# Patient Record
Sex: Male | Born: 1985 | Race: Asian | Hispanic: No | Marital: Married | Smoking: Never smoker
Health system: Southern US, Community
[De-identification: ages and names within clinical notes are randomized; demographics above are authoritative.]

## PROBLEM LIST (undated history)

## (undated) DIAGNOSIS — M1990 Unspecified osteoarthritis, unspecified site: Secondary | ICD-10-CM

## (undated) HISTORY — PX: LYMPH GLAND EXCISION: SHX13

---

## 2011-08-05 ENCOUNTER — Ambulatory Visit (INDEPENDENT_AMBULATORY_CARE_PROVIDER_SITE_OTHER): Payer: No Typology Code available for payment source | Admitting: Family Medicine

## 2011-08-05 ENCOUNTER — Ambulatory Visit: Payer: No Typology Code available for payment source

## 2011-08-05 VITALS — BP 121/85 | HR 79 | Temp 98.3°F | Resp 16 | Ht 64.0 in | Wt 171.0 lb

## 2011-08-05 DIAGNOSIS — M79643 Pain in unspecified hand: Secondary | ICD-10-CM

## 2011-08-05 DIAGNOSIS — M2550 Pain in unspecified joint: Secondary | ICD-10-CM

## 2011-08-05 DIAGNOSIS — M79609 Pain in unspecified limb: Secondary | ICD-10-CM

## 2011-08-05 MED ORDER — MELOXICAM 7.5 MG PO TABS: 7.5000 mg | ORAL_TABLET | Freq: Two times a day (BID) | ORAL | Status: AC | PRN

## 2011-08-05 NOTE — Progress Notes (Signed)
  [facility]:  Office Visit  Chief Complaint:  Chief Complaint  Patient presents with  . Hand Pain    hand pain when twisting open jars.  x 2 years    HPI: Wayne Wright is a 26 y.o. male who complains of : 1. Intermittent joint pain x 2 years when opening jars, when use, sharp pain, no swelling or redness associated  with pain, tries just ice with relief in 1 day, if not then takes several days. Denies RA/OA in family hisotry. He also sometimes has pain underneath bilateral big toes. Last flare up  was ysterday. No history of gout. No history of foods that would trigger gout except he does eat a lot of protein.   History reviewed. No pertinent past medical history. History reviewed. No pertinent past surgical history. History   Social History  . Marital Status: Married    Spouse Name: N/A    Number of Children: N/A  . Years of Education: N/A   Social History Main Topics  . Smoking status: Never Smoker   . Smokeless tobacco: None  . Alcohol Use: No  . Drug Use: No  . Sexually Active: None   Other Topics Concern  . None   Social History Narrative  . None   No family history on file. No Known Allergies Prior to Admission medications   Not on File     ROS: The patient denies fevers, chills, night sweats, unintentional weight loss, chest pain, palpitations, wheezing, dyspnea on exertion, nausea, vomiting, abdominal pain, dysuria, hematuria, melena, numbness, weakness, or tingling. + joint pain  All other systems have been reviewed and were otherwise negative with the exception of those mentioned in the HPI and as above.    PHYSICAL EXAM: Filed Vitals:   08/05/11 1551  BP: 121/85  Pulse: 79  Temp: 98.3 F (36.8 C)  Resp: 16   Filed Vitals:   08/05/11 1551  Height: 5\' 4"  (1.626 m)  Weight: 171 lb (77.565 kg)   Body mass index is 29.35 kg/(m^2).  General: Alert, no acute distress HEENT:  Normocephalic, atraumatic, oropharynx patent.    Cardiovascular:  Regular rate and rhythm, no rubs murmurs or gallops.  No Carotid bruits, radial pulse intact. No pedal edema.  Respiratory: Clear to auscultation bilaterally.  No wheezes, rales, or rhonchi.  No cyanosis, no use of accessory musculature GI: No organomegaly, abdomen is soft and non-tender, positive bowel sounds.  No masses. Skin: No rashes. Neurologic: Facial musculature symmetric. Psychiatric: Patient is appropriate throughout our interaction. Lymphatic: No cervical lymphadenopathy Musculoskeletal: Gait intact. Right hand: + minimal swelling of right fingers   LABS: No results found for this or any previous visit.   EKG/XRAY:   Primary read interpreted by Dr. Conley Rolls at [facility]. No fx/dislocations/jt deformities/OA of  bilateral hands   ASSESSMENT/PLAN: Encounter Diagnoses  Name Primary?  . Hand pain Yes  . Joint pain    Mobic 7.5 mg BID prn 1 month trial Pending RF ? Inflammatory arthritis    Wayne Arizmendi PHUONG, DO 08/05/2011 5:19 PM

## 2011-08-06 LAB — RHEUMATOID FACTOR: Rheumatoid fact SerPl-aCnc: <10 [IU]/mL (ref <=14)

## 2011-08-21 ENCOUNTER — Telehealth: Payer: Self-pay | Admitting: Family Medicine

## 2011-08-21 NOTE — Telephone Encounter (Signed)
LM that RF test normal.

## 2011-09-23 ENCOUNTER — Telehealth: Payer: Self-pay

## 2011-09-23 NOTE — Telephone Encounter (Signed)
Records ready for pickup. Patient notified. °

## 2011-09-23 NOTE — Telephone Encounter (Signed)
PT IS HAVING AN ISSUE WITH HIS INSURANCE COMPANY AND WOULD LIKE TO COME BY AND P/U A COPY OF HIS RECORDS WAS TOLD IT COULD TAKE UP TO 72 HRS. PLEASE CALL [phone]

## 2012-08-05 ENCOUNTER — Ambulatory Visit: Payer: No Typology Code available for payment source

## 2012-08-05 ENCOUNTER — Ambulatory Visit (INDEPENDENT_AMBULATORY_CARE_PROVIDER_SITE_OTHER): Payer: No Typology Code available for payment source | Admitting: Family Medicine

## 2012-08-05 VITALS — BP 114/77 | HR 71 | Temp 98.1°F | Resp 18 | Ht 63.6 in | Wt 181.0 lb

## 2012-08-05 DIAGNOSIS — M545 Low back pain, unspecified: Secondary | ICD-10-CM

## 2012-08-05 DIAGNOSIS — T148XXA Other injury of unspecified body region, initial encounter: Secondary | ICD-10-CM

## 2012-08-05 MED ORDER — TRAMADOL HCL 50 MG PO TABS: 50.0000 mg | ORAL_TABLET | Freq: Four times a day (QID) | ORAL | Status: DC | PRN

## 2012-08-05 MED ORDER — METHOCARBAMOL 500 MG PO TABS: 500.0000 mg | ORAL_TABLET | Freq: Every evening | ORAL | Status: DC | PRN

## 2012-08-05 NOTE — Patient Instructions (Signed)

## 2012-08-05 NOTE — Progress Notes (Signed)
  [facility]:  Office Visit  Chief Complaint:  Chief Complaint  Patient presents with  . Back Pain    x 3 days - moving sofa - pain down legs to mid thigh posteriorly    HPI: Wayne Wright is a 27 y.o. male who complains of  Low back pain after Moving sofa down stairs 2 days ago. He  was at the top of the stairs, he had pain and numbness going down back initially. It was not a super heavy sofa.  He has pain. He denies numbness currently, incontinence. Denies prior low back pain. Sharp 7/10 pain, has tried ibuprofen for it. Denies numbness, weakness, tingling. Walking actually makes it better, worse with prolong sitting or standing.   History reviewed. No pertinent past medical history. Past Surgical History  Procedure Laterality Date  . Lymph gland excision     History   Social History  . Marital Status: Married    Spouse Name: N/A    Number of Children: N/A  . Years of Education: N/A   Social History Main Topics  . Smoking status: Never Smoker   . Smokeless tobacco: Never Used  . Alcohol Use: No  . Drug Use: No  . Sexually Active: None   Other Topics Concern  . None   Social History Narrative  . None   Family History  Problem Relation Age of Onset  . Kidney disease Father    No Known Allergies Prior to Admission medications   Not on File     ROS: The patient denies fevers, chills, night sweats, unintentional weight loss, chest pain, palpitations, wheezing, dyspnea on exertion, nausea, vomiting, abdominal pain, dysuria, hematuria, melena, numbness, weakness, or tingling.  All other systems have been reviewed and were otherwise negative with the exception of those mentioned in the HPI and as above.    PHYSICAL EXAM: Filed Vitals:   08/05/12 0934  BP: 114/77  Pulse: 71  Temp: 98.1 F (36.7 C)  Resp: 18   Filed Vitals:   08/05/12 0934  Height: 5' 3.6" (1.615 m)  Weight: 181 lb (82.101 kg)   Body mass index is 31.48 kg/(m^2).  General:  Alert, no acute distress HEENT:  Normocephalic, atraumatic, oropharynx patent.  Cardiovascular:  Regular rate and rhythm, no rubs murmurs or gallops.  No Carotid bruits, radial pulse intact. No pedal edema.  Respiratory: Clear to auscultation bilaterally.  No wheezes, rales, or rhonchi.  No cyanosis, no use of accessory musculature GI: No organomegaly, abdomen is soft and non-tender, positive bowel sounds.  No masses. Skin: No rashes. Neurologic: Facial musculature symmetric. Psychiatric: Patient is appropriate throughout our interaction. Lymphatic: No cervical lymphadenopathy Musculoskeletal: Gait intact. 5/5 strength, sensation intact, 2/2 DTRs Decrease ROM in flexiosn, ext and SB, normal rotation + tenderness mid low back--L4   LABS: Results for orders placed in visit on 08/05/11  RHEUMATOID FACTOR      Result Value Range   Rheumatoid Factor <10  <=14 IU/mL     EKG/XRAY:   Primary read interpreted by Dr. Conley Rolls at [facility]. No fractures or dislocation   ASSESSMENT/PLAN: Encounter Diagnoses  Name Primary?  . Lower back pain Yes  . Sprain and strain    Patient will take ibuprofen 600-800 mg q8hrs prn with food Rx Robaxen and also tramadol Back exercises given F/u prn    Vern Prestia PHUONG, DO 08/05/2012 10:44 AM

## 2012-09-01 ENCOUNTER — Ambulatory Visit (INDEPENDENT_AMBULATORY_CARE_PROVIDER_SITE_OTHER): Payer: No Typology Code available for payment source | Admitting: Family Medicine

## 2012-09-01 ENCOUNTER — Ambulatory Visit: Payer: No Typology Code available for payment source

## 2012-09-01 VITALS — BP 114/75 | HR 82 | Temp 98.3°F | Resp 16 | Ht 64.0 in | Wt 178.4 lb

## 2012-09-01 DIAGNOSIS — M549 Dorsalgia, unspecified: Secondary | ICD-10-CM

## 2012-09-01 DIAGNOSIS — M5410 Radiculopathy, site unspecified: Secondary | ICD-10-CM

## 2012-09-01 DIAGNOSIS — M25552 Pain in left hip: Secondary | ICD-10-CM

## 2012-09-01 DIAGNOSIS — IMO0002 Reserved for concepts with insufficient information to code with codable children: Secondary | ICD-10-CM

## 2012-09-01 DIAGNOSIS — M25559 Pain in unspecified hip: Secondary | ICD-10-CM

## 2012-09-01 MED ORDER — NAPROXEN 500 MG PO TABS: 500.0000 mg | ORAL_TABLET | Freq: Two times a day (BID) | ORAL | Status: DC

## 2012-09-01 MED ORDER — METHYLPREDNISOLONE 4 MG PO KIT: PACK | ORAL | Status: DC

## 2012-09-01 NOTE — Progress Notes (Signed)
[facility]:  Office Visit  Chief Complaint:  Chief Complaint  Patient presents with  . Leg Pain    runs from lower back down leg. meds not working that were given at last visit  . Back Pain    HPI: Wayne Wright is a 27 y.o. male who complains of  Low back pain and left leg pain and radicular pain for 3 weeks now, thought it would go away with time. He can't walk very far not more than 50 yards, have to limp , sitting down there is a discomofrt,  Pain in leg when he urinates,  Has to bend so that he can walk without pain.  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 Has taken tramadol and msk relaxer with minimal relief No weakness, no numbness/tingling, just pain  History reviewed. No pertinent past medical history. Past Surgical History  Procedure Laterality Date  . Lymph gland excision     History   Social History  . Marital Status: Married    Spouse Name: N/A    Number of Children: N/A  . Years of Education: N/A   Social History Main Topics  . Smoking status: Never Smoker   . Smokeless tobacco: Never Used  . Alcohol Use: No  . Drug Use: No  . Sexually Active: None   Other Topics Concern  . None   Social History Narrative  . None   Family History  Problem Relation Age of Onset  . Kidney disease Father    No Known Allergies Prior to Admission medications   Medication Sig Start Date End Date Taking? Authorizing Provider  methocarbamol (ROBAXIN) 500 MG tablet Take 1 tablet (500 mg total) by mouth at bedtime as needed. 08/05/12   Kennidy Lamke P Hadassa Cermak, DO  traMADol (ULTRAM) 50 MG tablet Take 1 tablet (50 mg total) by mouth every 6 (six) hours as needed for pain. 08/05/12   Eesha Schmaltz P Dung Salinger, DO     ROS: The patient denies fevers, chills, night sweats, unintentional weight loss, chest pain, palpitations, wheezing, dyspnea on exertion, nausea, vomiting, abdominal  pain, dysuria, hematuria, melena, numbness, weakness, or tingling.   All other systems have been reviewed and were otherwise negative with the exception of those mentioned in the HPI and as above.    PHYSICAL EXAM: Filed Vitals:   09/01/12 1657  BP: 114/75  Pulse: 82  Temp: 98.3 F (36.8 C)  Resp: 16   Filed Vitals:   09/01/12 1657  Height: 5\' 4"  (1.626 m)  Weight: 178 lb 6.4 oz (80.922 kg)   Body mass index is 30.61 kg/(m^2).  General: Alert, no acute distress HEENT:  Normocephalic, atraumatic, oropharynx patent.  Cardiovascular:  Regular rate and rhythm, no rubs murmurs or gallops.  No Carotid bruits, radial pulse intact. No pedal edema.  Respiratory: Clear to auscultation bilaterally.  No wheezes, rales, or rhonchi.  No cyanosis, no use of accessory musculature GI: No organomegaly, abdomen is soft and non-tender, positive bowel sounds.  No masses. Skin: No rashes. Neurologic: Facial musculature symmetric. Psychiatric: Patient is appropriate throughout our interaction. Lymphatic: No cervical lymphadenopathy Musculoskeletal: Gait limping Lumbar spine-normal Left hip 5/5 strength ROM intact, sensation intact No saddle anesthesia. Rectal exam normal sphinter tone   LABS: Results for orders placed in visit on 08/05/11  RHEUMATOID FACTOR      Result Value Range  Rheumatoid Factor <10  <=14 IU/mL     EKG/XRAY:   Primary read interpreted by Dr[facility] Conley Rolls at [facility]. No fx/dislocation    ASSESSMENT/PLAN: Encounter Diagnoses  Name Primary?  . Left hip pain Yes  . Back pain   . Radicular pain    D/w patient xrays Rx prednisone Rx naproxen prn C/w Tramadol, Robaxin prn Advise proper body mechanics, lifting.  F/u prn    Kylieann Eagles PHUONG, DO 09/01/2012 6:39 PM

## 2012-09-01 NOTE — Patient Instructions (Signed)
Back Exercises Back exercises help treat and prevent back injuries. The goal of back exercises is to increase the strength of your abdominal and back muscles and the flexibility of your back. These exercises should be started when you no longer have back pain. Back exercises include:  Pelvic Tilt. Lie on your back with your knees bent. Tilt your pelvis until the lower part of your back is against the floor. Hold this position 5 to 10 sec and repeat 5 to 10 times.  Knee to Chest. Pull first 1 knee up against your chest and hold for 20 to 30 seconds, repeat this with the other knee, and then both knees. This may be done with the other leg straight or bent, whichever feels better.  Sit-Ups or Curl-Ups. Bend your knees 90 degrees. Start with tilting your pelvis, and do a partial, slow sit-up, lifting your trunk only 30 to 45 degrees off the floor. Take at least 2 to 3 seconds for each sit-up. Do not do sit-ups with your knees out straight. If partial sit-ups are difficult, simply do the above but with only tightening your abdominal muscles and holding it as directed.  Hip-Lift. Lie on your back with your knees flexed 90 degrees. Push down with your feet and shoulders as you raise your hips a couple inches off the floor; hold for 10 seconds, repeat 5 to 10 times.  Back arches. Lie on your stomach, propping yourself up on bent elbows. Slowly press on your hands, causing an arch in your low back. Repeat 3 to 5 times. Any initial stiffness and discomfort should lessen with repetition over time.  Shoulder-Lifts. Lie face down with arms beside your body. Keep hips and torso pressed to floor as you slowly lift your head and shoulders off the floor. Do not overdo your exercises, especially in the beginning. Exercises may cause you some mild back discomfort which lasts for a few minutes; however, if the pain is more severe, or lasts for more than 15 minutes, do not continue exercises until you see your caregiver.  Improvement with exercise therapy for back problems is slow.  See your caregivers for assistance with developing a proper back exercise program. Document Released: 04/21/2004 Document Revised: 06/06/2011 Document Reviewed: 01/13/2011 Exit[employer] Patient Information 2014 [employer].  

## 2013-03-22 ENCOUNTER — Ambulatory Visit: Payer: No Typology Code available for payment source

## 2013-03-22 ENCOUNTER — Ambulatory Visit (INDEPENDENT_AMBULATORY_CARE_PROVIDER_SITE_OTHER): Payer: No Typology Code available for payment source | Admitting: Family Medicine

## 2013-03-22 VITALS — BP 120/70 | HR 93 | Temp 100.2°F | Resp 16 | Ht 64.0 in | Wt 182.0 lb

## 2013-03-22 DIAGNOSIS — R05 Cough: Secondary | ICD-10-CM

## 2013-03-22 DIAGNOSIS — J189 Pneumonia, unspecified organism: Secondary | ICD-10-CM

## 2013-03-22 DIAGNOSIS — R059 Cough, unspecified: Secondary | ICD-10-CM

## 2013-03-22 DIAGNOSIS — R509 Fever, unspecified: Secondary | ICD-10-CM

## 2013-03-22 LAB — POCT CBC
Granulocyte percent: 72.5 %G (ref 37–80)
HCT, POC: 47 % (ref 43.5–53.7)
Hemoglobin: 14.8 g/dL (ref 14.1–18.1)
Lymph, poc: 3.3 (ref 0.6–3.4)
MCH, POC: 28.4 pg (ref 27–31.2)
MCHC: 31.5 g/dL — AB (ref 31.8–35.4)
MCV: 90 fL (ref 80–97)
MID (cbc): 0.9 (ref 0–0.9)
MPV: 8.1 fL (ref 0–99.8)
POC Granulocyte: 11.1 — AB (ref 2–6.9)
POC LYMPH PERCENT: 21.3 %L (ref 10–50)
POC MID %: 6.2 %M (ref 0–12)
Platelet Count, POC: 282 10*3/uL (ref 142–424)
RBC: 5.22 M/uL (ref 4.69–6.13)
RDW, POC: 13.8 %
WBC: 15.3 10*3/uL — AB (ref 4.6–10.2)

## 2013-03-22 LAB — POCT INFLUENZA A/B
Influenza A, POC: NEGATIVE
Influenza B, POC: NEGATIVE

## 2013-03-22 MED ORDER — LEVOFLOXACIN 500 MG PO TABS: 500.0000 mg | ORAL_TABLET | Freq: Every day | ORAL | Status: DC

## 2013-03-22 MED ORDER — HYDROCODONE-HOMATROPINE 5-1.5 MG/5ML PO SYRP: 5.0000 mL | ORAL_SOLUTION | Freq: Three times a day (TID) | ORAL | Status: DC | PRN

## 2013-03-22 NOTE — Progress Notes (Signed)
   Subjective:    Patient ID: Wayne Wright, male    DOB: 1985-11-22, 27 y.o.   MRN: [id number]  HPI 27 year old male presents for evaluation of cough x 2 weeks and fever x 3 days.  States cough began after being exposed to his niece who had been diagnosed with bronchitis.  Cough has been productive of yellow/green mucous. Fever began 3 days ago. Admits to some nasal congestion, PND, and sore throat. No hx of strep. No known strep or flu contacts.  He has been taking ibuprofen which does help with the fever.  Denies chest pain, wheezing, SOB, otalgia, headache, or dizziness. He does admit to some chest tightness while taking deep breaths.   Patient is otherwise healthy with no other concerns today.      Review of Systems  Constitutional: Positive for chills and fatigue.  HENT: Positive for congestion, postnasal drip, sinus pressure and sore throat.   Respiratory: Positive for chest tightness. Negative for shortness of breath and wheezing.   Cardiovascular: Negative for chest pain.  Gastrointestinal: Negative for nausea and vomiting.  Neurological: Negative for dizziness and headaches.       Objective:   Physical Exam  Constitutional: He appears well-developed and well-nourished.  HENT:  Head: Normocephalic and atraumatic.  Right Ear: Hearing, tympanic membrane, external ear and ear canal normal.  Left Ear: Hearing, tympanic membrane, external ear and ear canal normal.  Mouth/Throat: Uvula is midline, oropharynx is clear and moist and mucous membranes are normal.  Eyes: Conjunctivae are normal.  Neck: Normal range of motion. Neck supple.  Cardiovascular: Normal rate, regular rhythm and normal heart sounds.   Pulmonary/Chest: Effort normal and breath sounds normal.  Lymphadenopathy:    He has no cervical adenopathy.  Neurological: He is alert.  Psychiatric: He has a normal mood and affect. His behavior is normal. Judgment and thought content normal.    [facility] reading (PRIMARY) by  Dr.  Cleta Alberts as linear density seen on later. Increased marking bilaterally with small infiltrate on right cardriophrenic border.  Please comment on linear density.         Assessment & Plan:  Pneumonia - Plan: levofloxacin (LEVAQUIN) 500 MG tablet  Cough - Plan: DG Chest 2 View, POCT CBC, POCT Influenza A/B, HYDROcodone-homatropine (HYCODAN) 5-1.5 MG/5ML syrup  Fever, unspecified - Plan: DG Chest 2 View, POCT CBC, POCT Influenza A/B  Will treat with Levaquin 500 mg daily x 10 days Hycodan q8hours prn cough - caution sedation Increase fluids and rest RTC for recheck if fever does not break over next 24-48 hours, sooner if worse

## 2013-04-24 NOTE — Progress Notes (Signed)
Reviewed documentation and xray and agree w/ assessment and plan. Ell Tiso, MD MPH   

## 2013-07-15 ENCOUNTER — Ambulatory Visit: Payer: 59 | Admitting: Neurology

## 2013-07-24 ENCOUNTER — Ambulatory Visit (INDEPENDENT_AMBULATORY_CARE_PROVIDER_SITE_OTHER): Payer: 59 | Admitting: Neurology

## 2013-07-24 ENCOUNTER — Encounter: Payer: Self-pay | Admitting: Neurology

## 2013-07-24 ENCOUNTER — Encounter (INDEPENDENT_AMBULATORY_CARE_PROVIDER_SITE_OTHER): Payer: Self-pay

## 2013-07-24 VITALS — BP 111/77 | HR 77 | Ht 63.5 in | Wt 185.5 lb

## 2013-07-24 DIAGNOSIS — H5440 Blindness, one eye, unspecified eye: Secondary | ICD-10-CM | POA: Insufficient documentation

## 2013-07-24 DIAGNOSIS — M2550 Pain in unspecified joint: Secondary | ICD-10-CM | POA: Insufficient documentation

## 2013-07-24 NOTE — Progress Notes (Signed)
PATIENT: Wayne Wright DOB: May 16, 1985  HISTORICAL  Wayne Wright is about 28 years old right-handed male, referred by urgent care for evaluation of pain at both hands, stiffness  He noticed issues with both hands for 4 years, since 2011, everytime he tried to open a jaw or a tight screw, if he put any hard pressure on his hand, he felt bilateral hands joints achy pain afterwards, especially metaphalangeal joints, he could not move any fingers for few hours,, ibuprofen, ice would help.   If not taking ibuprofen, instead of dealing with achy pain for few hours, he has to take 2 or 3 days to recover  There was similar achy painful sensation at the metatarsal joints occasionally, with prolonged walking, hiking,  He denies persistent sensory loss, no weakness, no neck pain, no radiating pain,  He immigrant from [place], noticed gradual worsening of his left vision over the past 6 years, around 2009, he noticed gradual worsening left vision, was seen by different ophthalmologists over the past few years without clear etiology found. Per patient, he first noticed difficulty when he was watching 3-D movies, he noticed a huge vision difference between left and right eye.  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  Laboratory evaluation showed normal TSH, ESR, uric acid, rheumatoid factor, negative ANA   REVIEW OF SYSTEMS: Full 14 system review of systems performed and notable only for joint pain  ALLERGIES: No Known Allergies  HOME MEDICATIONS: No current outpatient prescriptions on file prior to visit.   No current facility-administered medications on file prior to visit.    PAST MEDICAL HISTORY: History reviewed. No pertinent past medical history.  PAST SURGICAL HISTORY: Past Surgical History  Procedure Laterality Date  . Lymph gland excision      FAMILY HISTORY: Family History  Problem Relation Age of Onset  . Kidney disease Father     SOCIAL HISTORY:  History    Social History  . Marital Status: Married    Spouse Name: Wayne Wright    Number of Children: o  . Years of Education: college   Occupational History  . Computers    Social History Main Topics  . Smoking status: Never Smoker   . Smokeless tobacco: Never Used  . Alcohol Use: No  . Drug Use: No  . Sexual Activity: Not on file   Other Topics Concern  . Not on file   Social History Narrative   Patient lives at home with his wife Wayne Wright)    Patient works full time at [employer].   Education B.S.   Right handed   Caffeine none    PHYSICAL EXAM   Filed Vitals:   07/24/13 0808  BP: 111/77  Pulse: 77  Height: 5' 3.5" (1.613 m)  Weight: 185 lb 8 oz (84.142 kg)    Not recorded    Body mass index is 32.34 kg/(m^2).   Generalized: In no acute distress  Neck: Supple, no carotid bruits   Cardiac: Regular rate rhythm  Pulmonary: Clear to auscultation bilaterally  Musculoskeletal: No deformity  Neurological examination  Mentation: Alert oriented to time, place, history taking, and causual conversation  Cranial nerve II-XII: left pupil afferent defect.  Extraocular movements were full.  Visual field were full on confrontational test. Bilateral fundi were sharp. Left extropia, right eye is the dominant eye. Facial sensation and strength were normal. Hearing was intact to finger rubbing bilaterally. Uvula tongue midline.  Head turning and shoulder shrug and were  normal and symmetric.Tongue protrusion into cheek strength was normal.  Motor: Normal tone, bulk and strength.  Sensory: Intact to fine touch, pinprick, preserved vibratory sensation, and proprioception at toes.  Coordination: Normal finger to nose, heel-to-shin bilaterally there was no truncal ataxia  Gait: Rising up from seated position without assistance, normal stance, without trunk ataxia, moderate stride, good arm swing, smooth turning, able to perform tiptoe, and heel walking without difficulty.    Romberg signs: Negative  Deep tendon reflexes: Brachioradialis 2/2, biceps 2/2, triceps 2/2, patellar 2/2, Achilles 2/2, plantar responses were flexor bilaterally.   DIAGNOSTIC DATA (LABS, IMAGING, TESTING) - I reviewed patient records, labs, notes, testing and imaging myself where available.  Lab Results  Component Value Date   WBC 15.3* 03/22/2013   HGB 14.8 03/22/2013   HCT 47.0 03/22/2013   MCV 90.0 03/22/2013     ASSESSMENT AND PLAN  Wayne Wright is a 28 y.o. male complains of  6 years history of gradually worsening left vision, occasionally left-sided headaches, also presenting with intermittent bilateral hands, and feet achy pain, on examination,  He has left extropia, finger counting on left eye, diffusely hyperreflexia of bilateral upper and lower extremities.  1. Complete evaluation with MRI of brain with and without contrast, MRI of cervical spine to rule out cervical myelopathy with his diffuse hyperreflexia 2. Laboratory evaluations, including vitamin B12, and inflammatory markers 3.I will call him about result, further evaluations will depend on MRI findings,  Marcial Pacas, M.D. Ph.D.  [facility] [address] [place], [place] [address] [phone]

## 2013-07-25 LAB — COMPREHENSIVE METABOLIC PANEL
ALBUMIN: 4.5 g/dL (ref 3.5–5.5)
ALT: 55 IU/L — ABNORMAL HIGH (ref 0–44)
AST: 28 IU/L (ref 0–40)
Albumin/Globulin Ratio: 1.3 (ref 1.1–2.5)
Alkaline Phosphatase: 82 IU/L (ref 39–117)
BUN/Creatinine Ratio: 21 — ABNORMAL HIGH (ref 8–19)
BUN: 16 mg/dL (ref 6–20)
CALCIUM: 9.8 mg/dL (ref 8.7–10.2)
CO2: 21 mmol/L (ref 18–29)
CREATININE: 0.78 mg/dL (ref 0.76–1.27)
Chloride: 99 mmol/L (ref 97–108)
GFR calc Af Amer: 143 mL/min/{1.73_m2} (ref >59)
GFR calc non Af Amer: 124 mL/min/{1.73_m2} (ref >59)
GLOBULIN, TOTAL: 3.4 g/dL (ref 1.5–4.5)
Glucose: 95 mg/dL (ref 65–99)
Potassium: 4.4 mmol/L (ref 3.5–5.2)
Sodium: 142 mmol/L (ref 134–144)
Total Bilirubin: 0.7 mg/dL (ref 0.0–1.2)
Total Protein: 7.9 g/dL (ref 6.0–8.5)

## 2013-07-25 LAB — FOLATE: FOLATE: 7 ng/mL (ref >3.0)

## 2013-07-25 LAB — CBC WITH DIFFERENTIAL
BASOS: 0 %
Basophils Absolute: 0 10*3/uL (ref 0.0–0.2)
EOS: 2 %
Eosinophils Absolute: 0.2 10*3/uL (ref 0.0–0.4)
HCT: 46.2 % (ref 37.5–51.0)
HEMOGLOBIN: 15.5 g/dL (ref 12.6–17.7)
IMMATURE GRANS (ABS): 0 10*3/uL (ref 0.0–0.1)
Immature Granulocytes: 0 %
LYMPHS ABS: 2.7 10*3/uL (ref 0.7–3.1)
Lymphs: 33 %
MCH: 29 pg (ref 26.6–33.0)
MCHC: 33.5 g/dL (ref 31.5–35.7)
MCV: 87 fL (ref 79–97)
MONOCYTES: 6 %
Monocytes Absolute: 0.5 10*3/uL (ref 0.1–0.9)
NEUTROS PCT: 59 %
Neutrophils Absolute: 4.9 10*3/uL (ref 1.4–7.0)
PLATELETS: 221 10*3/uL (ref 150–379)
RBC: 5.34 x10E6/uL (ref 4.14–5.80)
RDW: 14.2 % (ref 12.3–15.4)
WBC: 8.3 10*3/uL (ref 3.4–10.8)

## 2013-07-25 LAB — RPR: RPR: NONREACTIVE

## 2013-07-25 LAB — CK: Total CK: 106 U/L (ref 24–204)

## 2013-07-25 LAB — HIV ANTIBODY (ROUTINE TESTING W REFLEX): HIV 1/HIV 2 AB: NONREACTIVE

## 2013-07-25 LAB — C-REACTIVE PROTEIN: CRP: 7.3 mg/L — ABNORMAL HIGH (ref 0.0–4.9)

## 2013-07-25 LAB — VITAMIN B12: Vitamin B-12: 464 pg/mL (ref 211–946)

## 2013-07-25 LAB — ANA W/REFLEX IF POSITIVE: Anti Nuclear Antibody(ANA): NEGATIVE

## 2013-07-31 ENCOUNTER — Other Ambulatory Visit: Payer: 59

## 2013-08-01 ENCOUNTER — Ambulatory Visit (INDEPENDENT_AMBULATORY_CARE_PROVIDER_SITE_OTHER): Payer: 59

## 2013-08-01 ENCOUNTER — Other Ambulatory Visit: Payer: 59

## 2013-08-01 DIAGNOSIS — M2550 Pain in unspecified joint: Secondary | ICD-10-CM

## 2013-08-01 MED ORDER — GADOPENTETATE DIMEGLUMINE 469.01 MG/ML IV SOLN: 17.0000 mL | Freq: Once | INTRAVENOUS | Status: AC | PRN

## 2013-08-02 ENCOUNTER — Telehealth: Payer: Self-pay | Admitting: Neurology

## 2013-08-02 NOTE — Progress Notes (Signed)
Quick Note:  Left message with lab results, per Dr. Terrace ArabiaYan. Told to call the office with any questions. ______

## 2013-08-02 NOTE — Telephone Encounter (Signed)
Pt called wanting to get more information and has more questions, please cal pt concerning this matter. Thanks

## 2013-08-05 NOTE — Telephone Encounter (Signed)
I have called patient, there is no significant abnormality at imaging, and the laboratory evaluations, slight elevated C. reactive protein of unknown clinical significance,

## 2013-08-05 NOTE — Telephone Encounter (Signed)
Dr. Terrace ArabiaYan please call the patient he has questions about test results and visit. Best contact for patient is [phone] (H)

## 2013-08-06 ENCOUNTER — Telehealth: Payer: Self-pay | Admitting: Neurology

## 2013-08-06 NOTE — Telephone Encounter (Signed)
Pt's husband calling wanting to know what the next step is after the MRI. Please advise

## 2013-08-06 NOTE — Telephone Encounter (Signed)
Patient spouse called wanting to know what's next after the MRI.  Please call and advise.

## 2013-08-06 NOTE — Telephone Encounter (Signed)
I have called [phone] second time,   I relayed the message that MRI cervical and brain, lab showed no siginficant abnormalities,  He should continue follow up with his pcp or rheumatologist

## 2014-11-13 IMAGING — CR DG LUMBAR SPINE COMPLETE 4+V
5 series · 5 of 5 positions shown · non-contrast
Comparison: None.

CLINICAL DATA: Low back pain after moving furniture.

LUMBAR SPINE - COMPLETE 4+ VIEW

[AP]
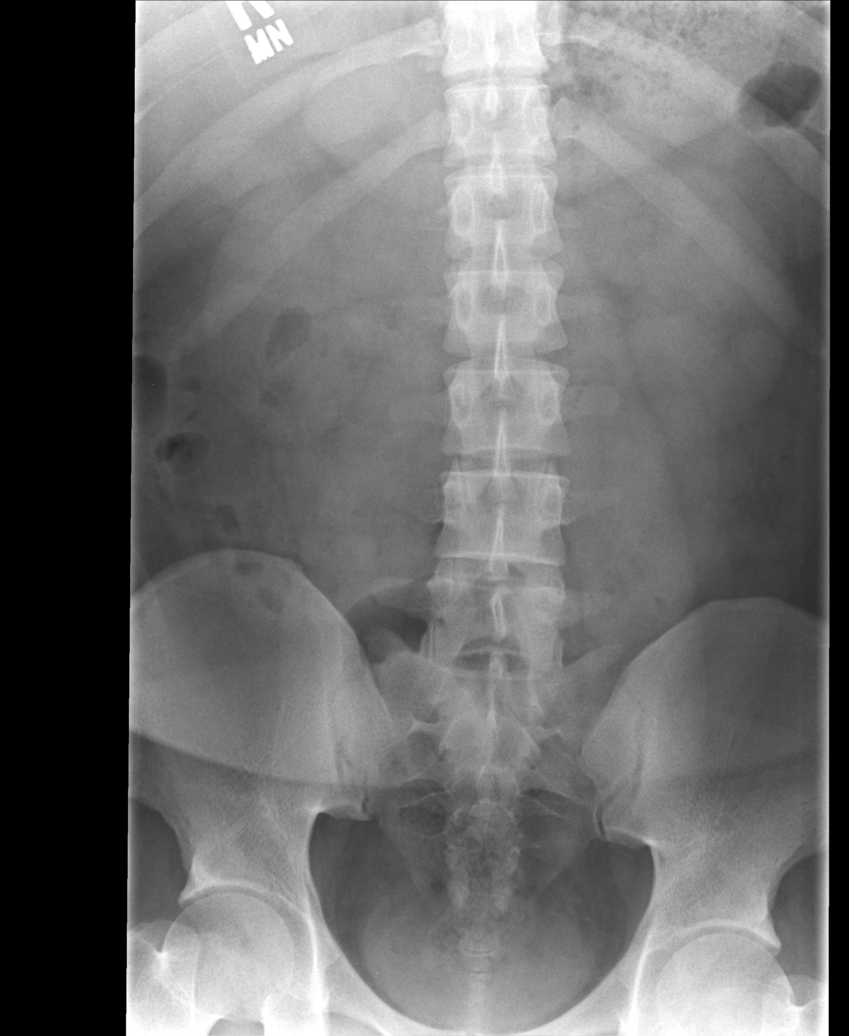

[rpo]
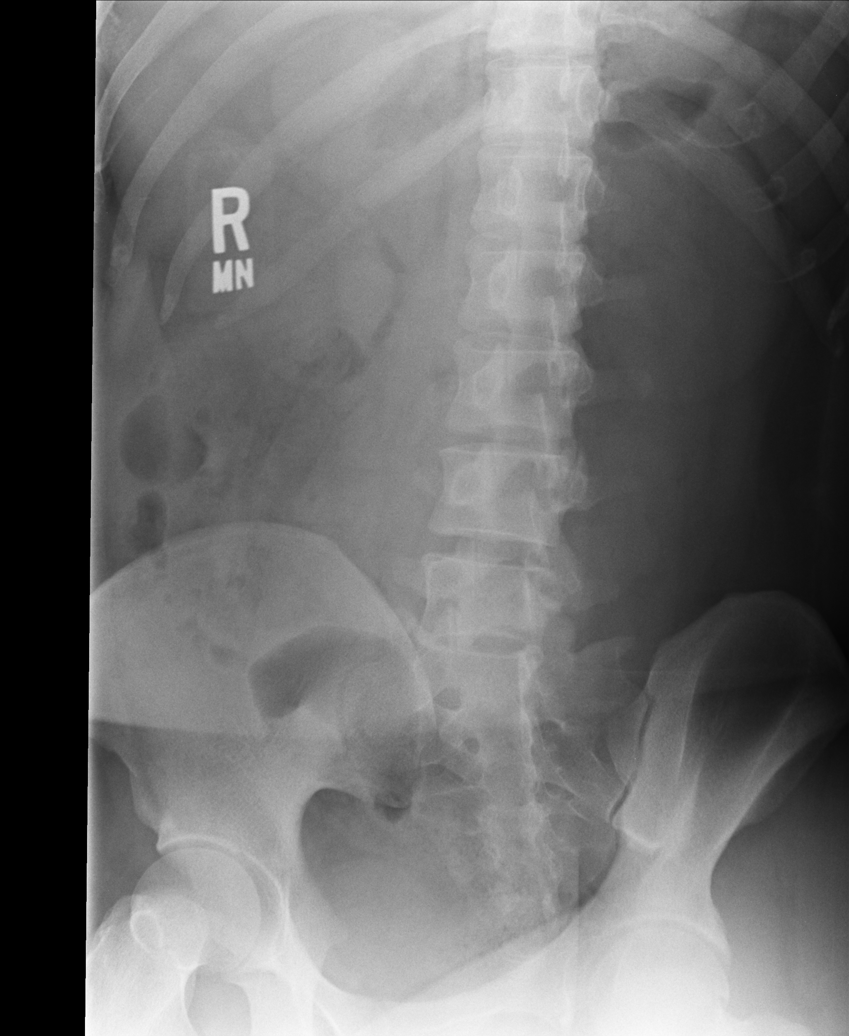

[lpo]
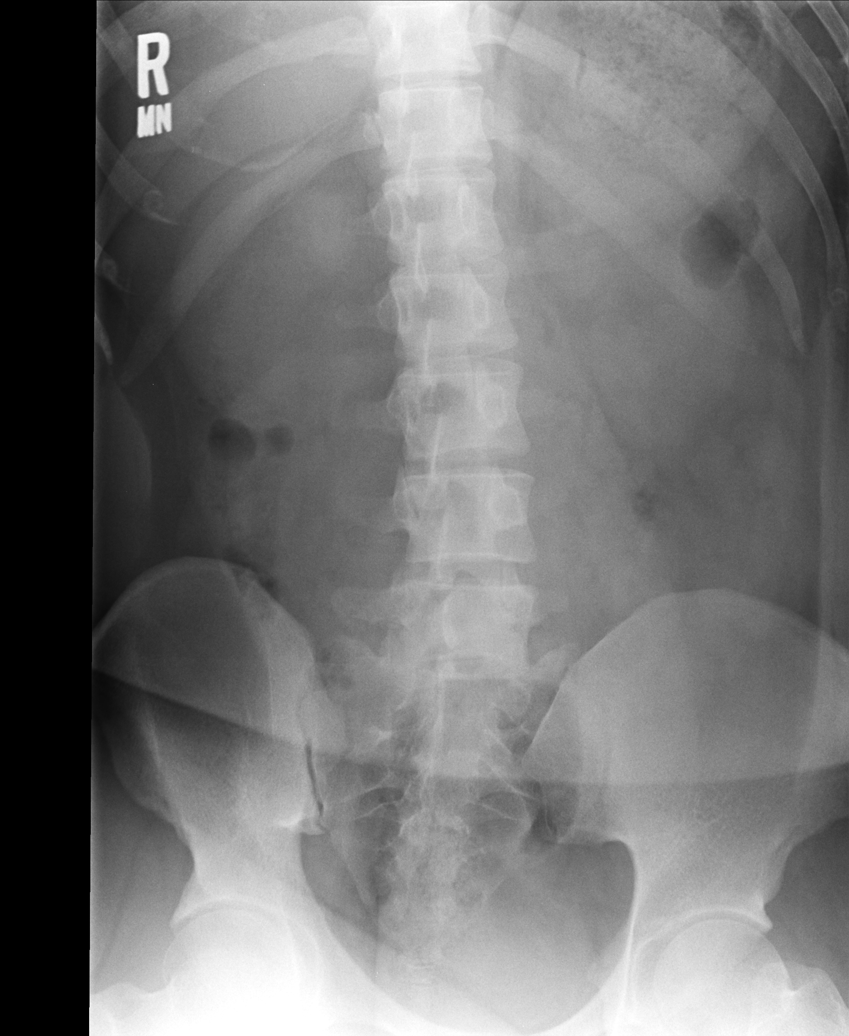

[lateral]
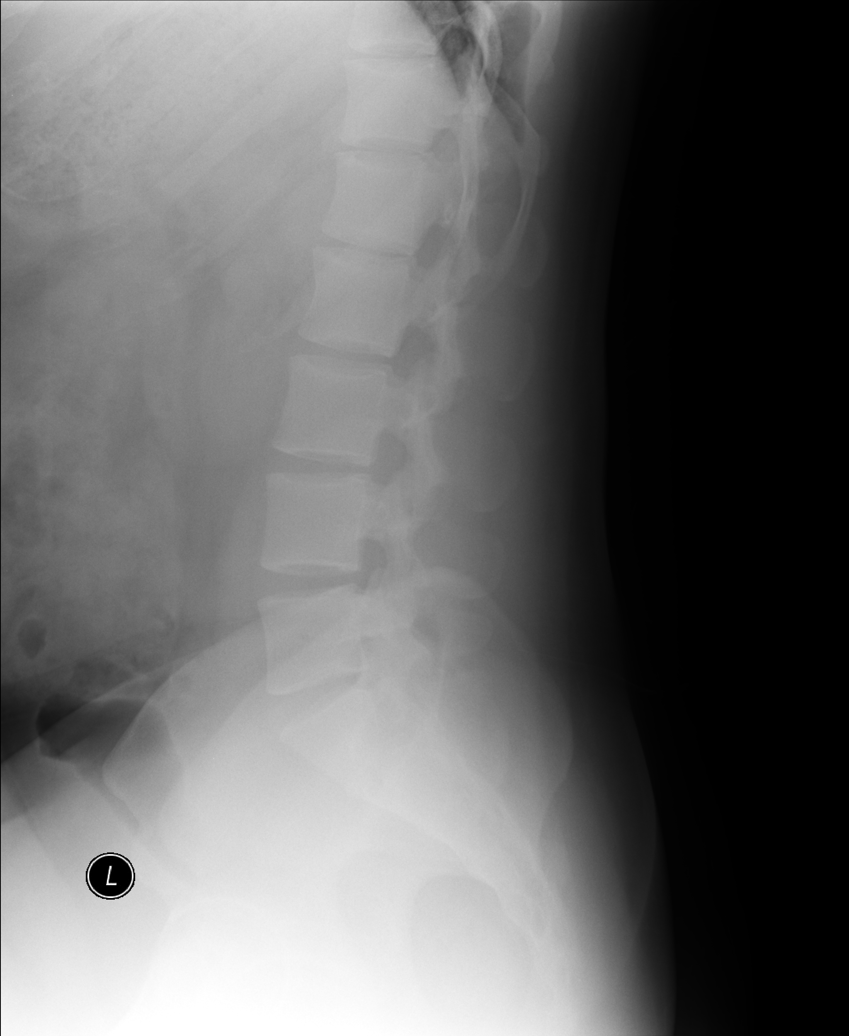

[l5 s1]
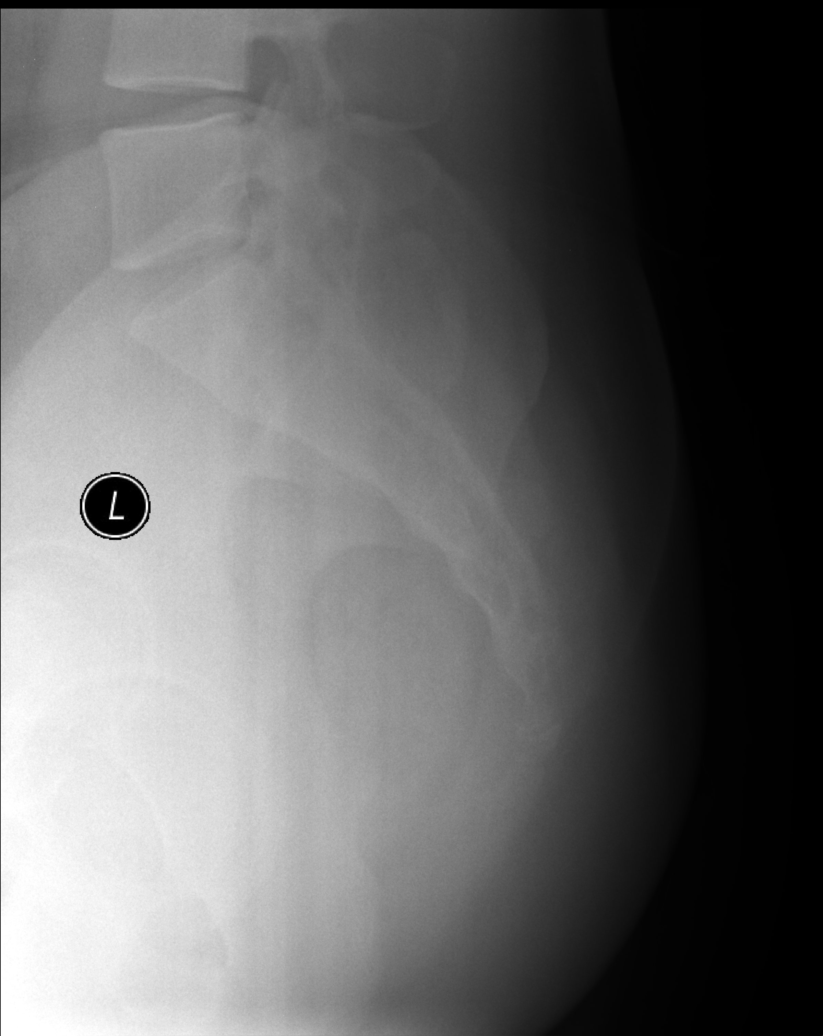

[5 of 5 positions shown; findings below may reference images not displayed]

FINDINGS: Five non-rib bearing lumbar type vertebral bodies are
present.  The vertebral body heights and alignment maintained.  The
disc spaces are intact.  No acute bone or soft tissue abnormalities
are present.
IMPRESSION: Normal radiographs of the lumbar spine.

## 2015-04-07 ENCOUNTER — Ambulatory Visit (INDEPENDENT_AMBULATORY_CARE_PROVIDER_SITE_OTHER): Payer: Commercial Managed Care - HMO

## 2015-04-07 ENCOUNTER — Ambulatory Visit (INDEPENDENT_AMBULATORY_CARE_PROVIDER_SITE_OTHER): Payer: Commercial Managed Care - HMO | Admitting: Family Medicine

## 2015-04-07 VITALS — BP 126/80 | HR 80 | Temp 98.4°F | Resp 17 | Ht 63.5 in | Wt 185.0 lb

## 2015-04-07 DIAGNOSIS — R0789 Other chest pain: Secondary | ICD-10-CM

## 2015-04-07 MED ORDER — MELOXICAM 7.5 MG PO TABS: 15.0000 mg | ORAL_TABLET | Freq: Every day | ORAL | Status: AC

## 2015-04-07 NOTE — Patient Instructions (Signed)

## 2015-04-07 NOTE — Progress Notes (Signed)
[facility] [address], [place] [id number] [phone]  Date:  04/07/2015   Name:  Wayne Wright   DOB:  11/02/1985   MRN:  [id number]  PCP:  No PCP Per Patient    Chief Complaint: Chest Pain   History of Present Illness:  Wayne Mareeng Cryan is a 30 y.o. very pleasant male patient who presents with the following: Right sided chest pain that began 2-3 days ago:  - Pain with exhale - 7/10 pain.  - Coughing minimally off and on - Increased pain with rotation.  - No noticeable worsening with exertion.  - No similar issue in the past. - 1-2 ibuprofen several days ago.  - Currently works in Consulting civil engineerT, Technical brewerdesktop engineer.   - No sick contacts.  - No trauma. - No skipped beats/palpatations.  - No GI symptoms.  - No trips out of the country in the last year other than [place]   Family: no fam hx of cardiac disease/sudden cardiac death. No blood clot hx.   Patient Active Problem List   Diagnosis Date Noted  . Joint pain 07/24/2013  . Blindness of left eye 07/24/2013    No past medical history on file.  Past Surgical History  Procedure Laterality Date  . Lymph gland excision      Social History  Substance Use Topics  . Smoking status: Never Smoker   . Smokeless tobacco: Never Used  . Alcohol Use: No    Family History  Problem Relation Age of Onset  . Kidney disease Father     No Known Allergies  Medication list has been reviewed and updated.  Current Outpatient Prescriptions on File Prior to Visit  Medication Sig Dispense Refill  . ibuprofen (ADVIL,MOTRIN) 200 MG tablet Take 200 mg by mouth every 6 (six) hours as needed.     No current facility-administered medications on file prior to visit.    Review of Systems:  HPI as above.   Physical Examination: Filed Vitals:   04/07/15 1906  BP: 126/80  Pulse: 80  Temp: 98.4 F (36.9 C)  Resp: 17   Filed Vitals:   04/07/15 1906  Height: 5' 3.5" (1.613 m)  Weight: 185 lb  (83.915 kg)   Body mass index is 32.25 kg/(m^2). Ideal Body Weight: Weight in (lb) to have BMI = 25: 143.1 GEN: WDWN, NAD, Non-toxic, A & O x 3 HEENT: Atraumatic, Normocephalic. Neck supple. No masses, No LAD. Well healed scar on right neck.  CV: RRR, No M/G/R. No JVD. No thrill. No extra heart sounds. PULM: CTA B, no wheezes, crackles, rhonchi. No retractions. No resp. distress. No accessory muscle use. TTP over right anterior chest wall, 1 inch medial to the midclavicular line.  ABD: S, NT, ND, +BS. No rebound. No HSM. EXTR: No lower extremity edema/calf pain.  NEURO Normal gait.  PSYCH: Normally interactive. Conversant. Not depressed or anxious appearing.  Calm demeanor.   EKG: Sinus with rate of 72, QTc of 399. No ST elevation.    [facility] reading (PRIMARY) by  Dr. Mayford KnifeWilliams.    Assessment and Plan: Chest wall pain: Chest wall TTP, non-exertional. No additional symptoms. EKG/CXR wnl.  NO DVT risk factors.  - Will Rx mobic QD for 1 week.  - f/u 1 week if not improving.    Signed Guinevere ScarletBlake Kelly Eisler, MD

## 2019-12-13 ENCOUNTER — Emergency Department (HOSPITAL_COMMUNITY): Payer: 59

## 2019-12-13 ENCOUNTER — Emergency Department (HOSPITAL_COMMUNITY)
Admission: EM | Admit: 2019-12-13 | Discharge: 2019-12-13 | Disposition: A | Discharge status: Home / Self Care | Payer: 59 | Attending: Emergency Medicine | Admitting: Emergency Medicine

## 2019-12-13 ENCOUNTER — Other Ambulatory Visit: Payer: Self-pay

## 2019-12-13 DIAGNOSIS — R1031 Right lower quadrant pain: Secondary | ICD-10-CM | POA: Diagnosis present

## 2019-12-13 DIAGNOSIS — N201 Calculus of ureter: Secondary | ICD-10-CM | POA: Diagnosis not present

## 2019-12-13 LAB — CBC WITH DIFFERENTIAL/PLATELET
Abs Immature Granulocytes: 0.07 10*3/uL (ref 0.00–0.07)
Basophils Absolute: 0.1 10*3/uL (ref 0.0–0.1)
Basophils Relative: 1 %
Eosinophils Absolute: 0.1 10*3/uL (ref 0.0–0.5)
Eosinophils Relative: 1 %
HCT: 45.9 % (ref 39.0–52.0)
Hemoglobin: 14.6 g/dL (ref 13.0–17.0)
Immature Granulocytes: 1 %
Lymphocytes Relative: 32 %
Lymphs Abs: 4.8 10*3/uL — ABNORMAL HIGH (ref 0.7–4.0)
MCH: 28.6 pg (ref 26.0–34.0)
MCHC: 31.8 g/dL (ref 30.0–36.0)
MCV: 89.8 fL (ref 80.0–100.0)
Monocytes Absolute: 0.6 10*3/uL (ref 0.1–1.0)
Monocytes Relative: 4 %
Neutro Abs: 9.5 10*3/uL — ABNORMAL HIGH (ref 1.7–7.7)
Neutrophils Relative %: 61 %
Platelets: 280 10*3/uL (ref 150–400)
RBC: 5.11 MIL/uL (ref 4.22–5.81)
RDW: 13.2 % (ref 11.5–15.5)
WBC: 15.1 10*3/uL — ABNORMAL HIGH (ref 4.0–10.5)
nRBC: 0 % (ref 0.0–0.2)

## 2019-12-13 LAB — URINALYSIS, ROUTINE W REFLEX MICROSCOPIC
Bilirubin Urine: NEGATIVE
Glucose, UA: NEGATIVE mg/dL
Ketones, ur: NEGATIVE mg/dL
Leukocytes,Ua: NEGATIVE
Nitrite: NEGATIVE
Protein, ur: 30 mg/dL — AB
RBC / HPF: >50 RBC/hpf — ABNORMAL HIGH (ref 0–5)
Specific Gravity, Urine: 1.02 (ref 1.005–1.030)
pH: 5 (ref 5.0–8.0)

## 2019-12-13 LAB — COMPREHENSIVE METABOLIC PANEL
ALT: 79 U/L — ABNORMAL HIGH (ref 0–44)
AST: 50 U/L — ABNORMAL HIGH (ref 15–41)
Albumin: 4.4 g/dL (ref 3.5–5.0)
Alkaline Phosphatase: 72 U/L (ref 38–126)
Anion gap: 15 (ref 5–15)
BUN: 10 mg/dL (ref 6–20)
CO2: 19 mmol/L — ABNORMAL LOW (ref 22–32)
Calcium: 9.2 mg/dL (ref 8.9–10.3)
Chloride: 104 mmol/L (ref 98–111)
Creatinine, Ser: 0.94 mg/dL (ref 0.61–1.24)
GFR calc Af Amer: >60 mL/min (ref >60)
GFR calc non Af Amer: >60 mL/min (ref >60)
Glucose, Bld: 161 mg/dL — ABNORMAL HIGH (ref 70–99)
Potassium: 3.6 mmol/L (ref 3.5–5.1)
Sodium: 138 mmol/L (ref 135–145)
Total Bilirubin: 0.8 mg/dL (ref 0.3–1.2)
Total Protein: 8.5 g/dL — ABNORMAL HIGH (ref 6.5–8.1)

## 2019-12-13 LAB — LIPASE, BLOOD: Lipase: 23 U/L (ref 11–51)

## 2019-12-13 MED ORDER — TAMSULOSIN HCL 0.4 MG PO CAPS: 0.4000 mg | ORAL_CAPSULE | Freq: Every day | ORAL | 0 refills | Status: AC

## 2019-12-13 MED ORDER — HYDROCODONE-ACETAMINOPHEN 5-325 MG PO TABS: 2.0000 | ORAL_TABLET | ORAL | 0 refills | Status: AC | PRN

## 2019-12-13 MED ORDER — KETOROLAC TROMETHAMINE 15 MG/ML IJ SOLN
15.0000 mg | Freq: Once | INTRAMUSCULAR | Status: AC
  Administered 2019-12-13: 15 mg via INTRAVENOUS
  Filled 2019-12-13: qty 1

## 2019-12-13 MED ORDER — ONDANSETRON HCL 4 MG/2ML IJ SOLN
4.0000 mg | Freq: Once | INTRAMUSCULAR | Status: AC
  Administered 2019-12-13: 4 mg via INTRAVENOUS
  Filled 2019-12-13: qty 2

## 2019-12-13 MED ORDER — MORPHINE SULFATE (PF) 4 MG/ML IV SOLN
4.0000 mg | Freq: Once | INTRAVENOUS | Status: AC
  Administered 2019-12-13: 4 mg via INTRAVENOUS
  Filled 2019-12-13: qty 1

## 2019-12-13 NOTE — ED Triage Notes (Signed)
C/O sudden onset of right lower quadrant abdominal pain.

## 2019-12-13 NOTE — ED Triage Notes (Signed)
Emergency Medicine Provider Triage Evaluation Note  Wayne Wright , a 34 y.o. male  was evaluated in triage.  Pt complains of abdominal pain.  Patient states he developed right lower quadrant abdominal pain around 430 this afternoon.  It began all of a sudden, severe and constant.  Nothing makes it better or worse.  He states it feels like he needs to urinate, but he has not done so.  He denies fevers, chills, nausea, vomiting, abnormal bowel movements.  He has not taken anything for it.  No history of similar.  No previous history of kidney stones.  He has never had any abdominal surgeries.  Review of Systems  Positive: RLQ abd pain Negative: N/v, fever  Physical Exam  BP 128/86 (BP Location: Right Arm)   Pulse 72   Temp 98.1 F (36.7 C) (Oral)   Resp (!) 22   SpO2 100%  Gen:   Awake, appears uncomfortable due to pain,.  HEENT:  Atraumatic Resp:  Normal effort  Cardiac:  Normal rate  Abd:   TTP of RLQ abd. +rebound. No rigidity or distention. No CVA tenderness.  MSK:   Moves extremities without difficulty Neuro:  Speech clear   Medical Decision Making  Medically screening exam initiated at 6:49 PM.  Appropriate orders placed.  Deshay Blumenfeld was informed that the remainder of the evaluation will be completed by another provider, this initial triage assessment does not replace that evaluation, and the importance of remaining in the ED until their evaluation is complete.  Clinical Impression   Patient presented for evaluation of right lower quadrant abdominal pain.  On exam, patient appears uncomfortable due to pain.  He does have tenderness to the right lower quadrant with positive rebound.  Consider appendectomy.  However with acute onset of pain, also consider kidney stone.  Will obtain labs, urine, and CT for further evaluation.    Alveria Apley, PA-C 12/13/19 1851

## 2019-12-13 NOTE — ED Notes (Signed)
Pt made aware of UA needed for testing and care. Pt will attempt in a few minutes. This RN will reassess at a later time.

## 2019-12-13 NOTE — ED Notes (Signed)
Patient verbalizes understanding of discharge instructions. Opportunity for questioning and answers were provided. Armband removed by staff, pt discharged from ED ambulatory to home.  

## 2019-12-13 NOTE — ED Provider Notes (Signed)
[facility] EMERGENCY DEPARTMENT Provider Note   CSN: [id number] Arrival date & time: 12/13/19  1804     History Chief Complaint  Patient presents with  . Abdominal Pain    Wayne Wright is a 34 y.o. male with no significant medical history presents the ED today for right lower quadrant pain.  He states that this began about 3 hours prior to presentation, is sharp, noncolicky and is associated with a sensation of needing to urinate.  Denies fevers or chills, does endorse nausea but no vomiting, no fevers or changes in urine appreciated.  No history of kidney stones.  The history is provided by the patient.  Abdominal Pain Pain location:  RLQ Pain quality: sharp   Pain radiates to:  Does not radiate Pain severity:  Severe Onset quality:  Sudden Duration:  3 hours Timing:  Constant Progression:  Unchanged Chronicity:  New Associated symptoms: no chest pain, no chills, no cough, no dysuria, no fever, no shortness of breath and no vomiting        No past medical history on file.  Patient Active Problem List   Diagnosis Date Noted  . Joint pain 07/24/2013  . Blindness of left eye 07/24/2013    Past Surgical History:  Procedure Laterality Date  . LYMPH GLAND EXCISION         Family History  Problem Relation Age of Onset  . Kidney disease Father     Social History   Tobacco Use  . Smoking status: Never Smoker  . Smokeless tobacco: Never Used  Substance Use Topics  . Alcohol use: No  . Drug use: No    Home Medications Prior to Admission medications   Medication Sig Start Date End Date Taking? Authorizing Provider  HYDROcodone-acetaminophen (NORCO/VICODIN) 5-325 MG tablet Take 2 tablets by mouth every 4 (four) hours as needed. 12/13/19   Loree Fee, MD  ibuprofen (ADVIL,MOTRIN) 200 MG tablet Take 200 mg by mouth every 6 (six) hours as needed.    [provider]  meloxicam (MOBIC) 7.5 MG tablet Take 2 tablets (15 mg total) by mouth  daily. 04/07/15   Guinevere Scarlet, MD  tamsulosin (FLOMAX) 0.4 MG CAPS capsule Take 1 capsule (0.4 mg total) by mouth daily. 12/13/19   Loree Fee, MD    Allergies    Patient has no known allergies.  Review of Systems   Review of Systems  Constitutional: Negative for chills and fever.  HENT: Negative for facial swelling and voice change.   Eyes: Negative for redness and visual disturbance.  Respiratory: Negative for cough and shortness of breath.   Cardiovascular: Negative for chest pain and palpitations.  Gastrointestinal: Positive for abdominal pain. Negative for vomiting.  Genitourinary: Negative for difficulty urinating and dysuria.  Musculoskeletal: Negative for gait problem and joint swelling.  Skin: Negative for rash and wound.  Neurological: Negative for dizziness and headaches.  Psychiatric/Behavioral: Negative for confusion and suicidal ideas.    Physical Exam Updated Vital Signs BP 108/73   Pulse 80   Temp 98.6 F (37 C) (Oral)   Resp 15   SpO2 96%   Physical Exam Exam conducted with a chaperone present.  Constitutional:      General: He is not in acute distress. HENT:     Head: Normocephalic and atraumatic.     Mouth/Throat:     Mouth: Mucous membranes are moist.     Pharynx: Oropharynx is clear.  Eyes:     General: No scleral icterus.  Pupils: Pupils are equal, round, and reactive to light.  Cardiovascular:     Rate and Rhythm: Normal rate and regular rhythm.     Pulses: Normal pulses.  Pulmonary:     Effort: Pulmonary effort is normal. No respiratory distress.  Abdominal:     General: There is no distension.     Tenderness: There is no abdominal tenderness.     Comments: No significant abdominal tenderness or CVA tenderness.    Genitourinary:    Comments: Testicles nontender bilaterally with positive cremasteric reflex Musculoskeletal:        General: No tenderness or deformity.     Cervical back: Normal range of motion and neck supple.    Neurological:     General: No focal deficit present.     Mental Status: He is alert and oriented to person, place, and time.  Psychiatric:        Mood and Affect: Mood normal.        Behavior: Behavior normal.     ED Results / Procedures / Treatments   Labs (all labs ordered are listed, but only abnormal results are displayed) Labs Reviewed  CBC WITH DIFFERENTIAL/PLATELET - Abnormal; Notable for the following components:      Result Value   WBC 15.1 (*)    Neutro Abs 9.5 (*)    Lymphs Abs 4.8 (*)    All other components within normal limits  COMPREHENSIVE METABOLIC PANEL - Abnormal; Notable for the following components:   CO2 19 (*)    Glucose, Bld 161 (*)    Total Protein 8.5 (*)    AST 50 (*)    ALT 79 (*)    All other components within normal limits  URINALYSIS, ROUTINE W REFLEX MICROSCOPIC - Abnormal; Notable for the following components:   APPearance HAZY (*)    Hgb urine dipstick LARGE (*)    Protein, ur 30 (*)    RBC / HPF >50 (*)    Bacteria, UA RARE (*)    All other components within normal limits  LIPASE, BLOOD    EKG None  Radiology CT Renal Stone Study  Result Date: 12/13/2019 CLINICAL DATA:  Right lower quadrant abdominal pain, nausea, history of nephrolithiasis EXAM: CT ABDOMEN AND PELVIS WITHOUT CONTRAST TECHNIQUE: Multidetector CT imaging of the abdomen and pelvis was performed following the standard protocol without IV contrast. COMPARISON:  None. FINDINGS: Lower chest: No acute pleural or parenchymal lung disease. Hepatobiliary: Diffuse hepatic steatosis without focal liver abnormality. No biliary dilation. The gallbladder is unremarkable. Pancreas: Unremarkable. No pancreatic ductal dilatation or surrounding inflammatory changes. Spleen: Normal in size without focal abnormality. Adrenals/Urinary Tract: There is mild right-sided obstructive uropathy related to a 3 mm obstructing distal right ureteral calculus, reference image 75 series 3. Right kidney is  otherwise unremarkable. The left kidney is normal. The bladder is minimally distended with no focal abnormalities. The adrenal glands are unremarkable. Stomach/Bowel: No bowel obstruction or ileus. Normal appendix right mid abdomen. No bowel wall thickening or inflammatory change. Vascular/Lymphatic: No significant vascular findings are present. No enlarged abdominal or pelvic lymph nodes. Reproductive: Prostate is unremarkable. Other: No free fluid or free gas.  No abdominal wall hernia. Musculoskeletal: No acute or destructive bony lesions. Reconstructed images demonstrate no additional findings. IMPRESSION: 1. Mild right-sided obstructive uropathy related to a 3 mm distal right ureteral calculus. 2. Hepatic steatosis. Electronically Signed   By: Sharlet Salina M.D.   On: 12/13/2019 19:33    Procedures Procedures (including critical care  time)  Medications Ordered in ED Medications  morphine 4 MG/ML injection 4 mg (4 mg Intravenous Given 12/13/19 2012)  ketorolac (TORADOL) 15 MG/ML injection 15 mg (15 mg Intravenous Given 12/13/19 2014)  ondansetron (ZOFRAN) injection 4 mg (4 mg Intravenous Given 12/13/19 2008)    ED Course  I have reviewed the triage vital signs and the nursing notes.  Pertinent labs & imaging results that were available during my care of the patient were reviewed by me and considered in my medical decision making (see chart for details).    MDM Rules/Calculators/A&P                          Differential diagnosis considered: Renal colic, appendicitis, bowel obstruction, IBD, gastroenteritis  Patient presents to ED with sudden onset of right lower quadrant pain that is sharp and noncolicky.  No infectious symptoms otherwise and given the sharp sudden nature of onset as well as sensation of needing to urinate, have a high suspicion for renal colic.  Abdominal laboratory panel obtained in triage and reviewed by myself, significant for leukocytosis of 15 although favor acute  stress response versus infection in the absence of fever and reassuring urine.  Chemistries with slight elevation in transaminases which is nonspecific.  No AKI appreciable  CT Noncon stone study obtained showing 3 mm right distal ureteral stone.  Patient given Toradol for comfort with significant provement in symptoms.  Patient counseled of need to follow-up with urology, will prescribe Flomax and short course of Norco.  Counseled return for intractable pain or intractable vomiting.  Counseled to strain urine and to follow-up within a few days.  Counseled the stone is likely to pass spontaneously.  Labs reviewed and interpreted by myself with significant findings above. Imaging reviewed by myself and interpreted by radiologist.  Case and plan above discussed with my attending Dr. Particia Nearing   Final Clinical Impression(s) / ED Diagnoses Final diagnoses:  Ureterolithiasis    Rx / DC Orders ED Discharge Orders         Ordered    HYDROcodone-acetaminophen (NORCO/VICODIN) 5-325 MG tablet  Every 4 hours PRN        12/13/19 2218    tamsulosin (FLOMAX) 0.4 MG CAPS capsule  Daily        12/13/19 2218         Labs, studies and imaging reviewed by myself and considered in medical decision making if ordered. Imaging interpreted by radiology. Pt was discussed with my attending, Dr. Particia Nearing  Electronically signed by:  Christiane Ha Redding9/17/202111:35 PM       Loree Fee, MD 12/13/19 5427    Jacalyn Lefevre, MD 12/16/19 1758

## 2019-12-31 ENCOUNTER — Telehealth (HOSPITAL_COMMUNITY): Payer: Self-pay | Admitting: Nurse Practitioner

## 2019-12-31 NOTE — Telephone Encounter (Signed)
Called to Discuss with patient about Covid symptoms and the use of the monoclonal antibody infusion for those with mild to moderate Covid symptoms and at a high risk of hospitalization.     Pt is qualified for this infusion due to co-morbid conditions and/or a member of an at-risk group.     Patient Active Problem List   Diagnosis Date Noted  . Joint pain 07/24/2013  . Blindness of left eye 07/24/2013    Patient wishes to consider his options and call back if he would like to be infused.  Symptoms tier reviewed as well as criteria for ending isolation. Preventative practices reviewed. Patient verbalized understanding.    Patient advised to call back if he/she decides that he/she does want to get infusion. Callback number to the infusion center given. Patient advised to go to Urgent care or ED with severe symptoms.

## 2020-02-10 ENCOUNTER — Ambulatory Visit: Payer: 59 | Admitting: Physician Assistant

## 2022-05-06 ENCOUNTER — Emergency Department (HOSPITAL_BASED_OUTPATIENT_CLINIC_OR_DEPARTMENT_OTHER): Payer: 59

## 2022-05-06 ENCOUNTER — Emergency Department (HOSPITAL_BASED_OUTPATIENT_CLINIC_OR_DEPARTMENT_OTHER)
Admission: EM | Admit: 2022-05-06 | Discharge: 2022-05-06 | Disposition: A | Discharge status: Home / Self Care | Payer: 59 | Attending: Emergency Medicine | Admitting: Emergency Medicine

## 2022-05-06 ENCOUNTER — Encounter (HOSPITAL_BASED_OUTPATIENT_CLINIC_OR_DEPARTMENT_OTHER): Payer: Self-pay | Admitting: Emergency Medicine

## 2022-05-06 ENCOUNTER — Other Ambulatory Visit: Payer: Self-pay

## 2022-05-06 DIAGNOSIS — R0789 Other chest pain: Secondary | ICD-10-CM

## 2022-05-06 HISTORY — DX: Unspecified osteoarthritis, unspecified site: M19.90

## 2022-05-06 LAB — CBC WITH DIFFERENTIAL/PLATELET
Abs Immature Granulocytes: 0.03 10*3/uL (ref 0.00–0.07)
Basophils Absolute: 0.1 10*3/uL (ref 0.0–0.1)
Basophils Relative: 1 %
Eosinophils Absolute: 0.2 10*3/uL (ref 0.0–0.5)
Eosinophils Relative: 2 %
HCT: 42.1 % (ref 39.0–52.0)
Hemoglobin: 14 g/dL (ref 13.0–17.0)
Immature Granulocytes: 0 %
Lymphocytes Relative: 38 %
Lymphs Abs: 3.3 10*3/uL (ref 0.7–4.0)
MCH: 27.7 pg (ref 26.0–34.0)
MCHC: 33.3 g/dL (ref 30.0–36.0)
MCV: 83.2 fL (ref 80.0–100.0)
Monocytes Absolute: 0.4 10*3/uL (ref 0.1–1.0)
Monocytes Relative: 5 %
Neutro Abs: 4.7 10*3/uL (ref 1.7–7.7)
Neutrophils Relative %: 54 %
Platelets: 254 10*3/uL (ref 150–400)
RBC: 5.06 MIL/uL (ref 4.22–5.81)
RDW: 13.9 % (ref 11.5–15.5)
WBC: 8.7 10*3/uL (ref 4.0–10.5)
nRBC: 0 % (ref 0.0–0.2)

## 2022-05-06 LAB — COMPREHENSIVE METABOLIC PANEL
ALT: 42 U/L (ref 0–44)
AST: 25 U/L (ref 15–41)
Albumin: 4.5 g/dL (ref 3.5–5.0)
Alkaline Phosphatase: 71 U/L (ref 38–126)
Anion gap: 9 (ref 5–15)
BUN: 7 mg/dL (ref 6–20)
CO2: 25 mmol/L (ref 22–32)
Calcium: 9.7 mg/dL (ref 8.9–10.3)
Chloride: 105 mmol/L (ref 98–111)
Creatinine, Ser: 0.64 mg/dL (ref 0.61–1.24)
GFR, Estimated: >60 mL/min (ref >60)
Glucose, Bld: 94 mg/dL (ref 70–99)
Potassium: 4.2 mmol/L (ref 3.5–5.1)
Sodium: 139 mmol/L (ref 135–145)
Total Bilirubin: 0.5 mg/dL (ref 0.3–1.2)
Total Protein: 8.4 g/dL — ABNORMAL HIGH (ref 6.5–8.1)

## 2022-05-06 LAB — LIPASE, BLOOD: Lipase: 16 U/L (ref 11–51)

## 2022-05-06 LAB — TROPONIN I (HIGH SENSITIVITY): Troponin I (High Sensitivity): <2 ng/L (ref <18)

## 2022-05-06 LAB — D-DIMER, QUANTITATIVE: D-Dimer, Quant: <0.27 ug/mL-FEU (ref 0.00–0.50)

## 2022-05-06 NOTE — ED Triage Notes (Signed)
Pt arrived POV, caox4, ambulatory, NAD. Pt states this morning while sitting at the computer working he had a sudden onset of sharp pain mid/slightly to the left in the chest lasting approx 1 min. Pt denies any SOB, N/V, diaphoresis, dizziness, or any other s/s occurring with the pain. Pt denies pain at present.  Pt reports similar occurrence last week, eval at UC, 12 lead ECG done, recommended further work up at ED but pt did not go.

## 2022-05-06 NOTE — ED Provider Notes (Signed)
[facility] Provider Note   CSN: [id number] Arrival date & time: 05/06/22  [id number]     History  Chief Complaint  Patient presents with   Chest Pain    Wayne Wright is a 37 y.o. male.  Patient with left-sided chest pain this morning for a few seconds.  May be worsening takes a deep breath.  No recent surgery or travel.  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  Denies any active pain.  No recent illnesses.  No fever cough or sputum production.  No significant cardiac history in the family.  Nothing makes it worse or better.  Denies any difficulty eating or drinking.  Does not think there is any relation to food.  The history is provided by the patient.       Home Medications Prior to Admission medications   Medication Sig Start Date End Date Taking? Authorizing Provider  HYDROcodone-acetaminophen (NORCO/VICODIN) 5-325 MG tablet Take 2 tablets by mouth every 4 (four) hours as needed. 12/13/19   Renold Genta, MD  ibuprofen (ADVIL,MOTRIN) 200 MG tablet Take 200 mg by mouth every 6 (six) hours as needed.    [provider]  meloxicam (MOBIC) 7.5 MG tablet Take 2 tablets (15 mg total) by mouth daily. 04/07/15   Gerre Pebbles, MD  tamsulosin (FLOMAX) 0.4 MG CAPS capsule Take 1 capsule (0.4 mg total) by mouth daily. 12/13/19   Renold Genta, MD      Allergies    Patient has no known allergies.    Review of Systems   Review of Systems  Physical Exam Updated Vital Signs BP 125/82 (BP Location: Left Arm)   Pulse 69   Temp 98.2 F (36.8 C) (Oral)   Resp 18   Ht 5' 3"$  (1.6 m)   Wt 84.4 kg   SpO2 98%   BMI 32.95 kg/m  Physical Exam Vitals and nursing note reviewed.  Constitutional:      General: He is not in acute distress.    Appearance: He is well-developed. He is not ill-appearing.  HENT:     Head: Normocephalic and atraumatic.  Eyes:     Extraocular Movements: Extraocular movements  intact.     Conjunctiva/sclera: Conjunctivae normal.     Pupils: Pupils are equal, round, and reactive to light.  Cardiovascular:     Rate and Rhythm: Normal rate and regular rhythm.     Pulses:          Radial pulses are 2+ on the right side and 2+ on the left side.     Heart sounds: No murmur heard. Pulmonary:     Effort: Pulmonary effort is normal. No respiratory distress.     Breath sounds: Normal breath sounds. No decreased breath sounds or wheezing.  Abdominal:     Palpations: Abdomen is soft.     Tenderness: There is no abdominal tenderness.  Musculoskeletal:        General: No swelling.     Cervical back: Normal range of motion and neck supple.  Skin:    General: Skin is warm and dry.     Capillary Refill: Capillary refill takes less than 2 seconds.  Neurological:     General: No focal deficit present.     Mental Status: He is alert.  Psychiatric:        Mood and Affect: Mood normal.     ED Results / Procedures / Treatments   Labs (all labs ordered are  listed, but only abnormal results are displayed) Labs Reviewed  CBC WITH DIFFERENTIAL/PLATELET  D-DIMER, QUANTITATIVE  COMPREHENSIVE METABOLIC PANEL  LIPASE, BLOOD  TROPONIN I (HIGH SENSITIVITY)    EKG EKG Interpretation  Date/Time:  Friday May 06 2022 09:41:40 EST Ventricular Rate:  68 PR Interval:  156 QRS Duration: 80 QT Interval:  364 QTC Calculation: 387 R Axis:   55 Text Interpretation: Normal sinus rhythm Normal ECG No previous ECGs available Confirmed by Lennice Sites ([id number]) on 05/06/2022 9:45:59 AM  Radiology DG Chest Portable 1 View  Result Date: 05/06/2022 CLINICAL DATA:  Chest pain EXAM: PORTABLE CHEST 1 VIEW COMPARISON:  Prior chest x-ray 04/08/2015 FINDINGS: The heart size and mediastinal contours are within normal limits. Both lungs are clear. The visualized skeletal structures are unremarkable. IMPRESSION: No active disease. Electronically Signed   By: Jacqulynn Cadet M.D.   On:  05/06/2022 10:12    Procedures Procedures    Medications Ordered in ED Medications - No data to display  ED Course/ Medical Decision Making/ A&P             HEART Score: 0                Medical Decision Making Amount and/or Complexity of Data Reviewed Labs: ordered. Radiology: ordered.   Wayne Wright is here with chest pain.  Patient with normal vitals.  No fever.  No cardiac risk factors.  Some brief sharp left chest pain that lasts about a minute.  Similar episode last week.  He has not been having any exertional chest pain.  He has no cardiac risk factors or family risk factors.  No recent surgery or travel.  Maybe some mild pleuritic nature to the pain could be somewhat reproducible as well.  EKG done in triage per my review interpretation shows sinus rhythm.  No ischemic changes.  Differential this is likely MSK related or GI related.  Will get troponin, D-dimer, CBC CMP and lipase to further evaluate for ACS, PE, pancreatitis.  She is very well-appearing.  Has not had any recent illnesses.  Doubt that this is myocarditis or pericarditis.  Per my review and interpretation of labs troponin is normal.  D-dimer is normal.  Have no concern for ACS or PE.  Very atypical story.  CBC, CMP are unremarkable.  No significant anemia or electrolyte abnormality or kidney injury.  Recommend rest and hydration.  Follow-up with primary care doctor if persistent.  Discharged in good condition.  This chart was dictated using voice recognition software.  Despite best efforts to proofread,  errors can occur which can change the documentation meaning.         Final Clinical Impression(s) / ED Diagnoses Final diagnoses:  Atypical chest pain    Rx / DC Orders ED Discharge Orders     None         Lennice Sites, DO 05/06/22 1107

## 2022-05-06 NOTE — ED Notes (Signed)
Discharge paperwork given and verbally understood. 

## 2022-05-06 NOTE — Discharge Instructions (Addendum)
Overall your workup today is normal.  You are not having a heart attack and there is no evidence of blood clot.  I suspect this is muscle related or acid reflux related.  Follow-up with your primary care doctor if you continue to have any episodes.  Return if symptoms greatly worsen.
